# Patient Record
Sex: Male | Born: 1985 | Race: Black or African American | Hispanic: No | Marital: Single | Smoking: Never smoker
Health system: Southern US, Community
[De-identification: ages and names within clinical notes are randomized; demographics above are authoritative.]

---
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
## 2008-01-01 ENCOUNTER — Ambulatory Visit (HOSPITAL_COMMUNITY): Admission: EM | Admit: 2008-01-01 | Discharge: 2008-01-01 | Payer: Self-pay | Admitting: Emergency Medicine

## 2008-02-08 ENCOUNTER — Encounter: Admission: RE | Admit: 2008-02-08 | Discharge: 2008-03-23 | Payer: Self-pay | Admitting: General Surgery

## 2008-03-27 ENCOUNTER — Encounter: Admission: RE | Admit: 2008-03-27 | Discharge: 2008-04-27 | Payer: Self-pay | Admitting: General Surgery

## 2010-03-11 ENCOUNTER — Emergency Department (HOSPITAL_COMMUNITY)
Admission: EM | Admit: 2010-03-11 | Discharge: 2010-03-12 | Payer: Self-pay | Source: Home / Self Care | Admitting: Emergency Medicine

## 2010-08-06 NOTE — Op Note (Signed)
NAMESTEFFEN, HASE                 ACCOUNT NO.:  [id number]   MEDICAL RECORD NO.:  [id number]          PATIENT TYPE:  AMB   LOCATION:  MAJO                         FACILITY:  [facility]   PHYSICIAN:  Johnette Abraham, MD    DATE OF BIRTH:  [birth year]   DATE OF PROCEDURE:  01/01/2008  DATE OF DISCHARGE:  01/01/2008                               OPERATIVE REPORT   PREOPERATIVE DIAGNOSIS:  Complex laceration of the right upper  extremity.   POSTOPERATIVE DIAGNOSIS:  Complex laceration of the right upper  extremity.   PROCEDURES:  1. Exploration of wounds, one on the dorsum of the right hand and one      on the volar aspect of the forearm.  2. Repair of flexor tendons in the forearm listing is as follows.      a.     FPL, FDS to the index finger, long finger, ring finger and       the small finger.      b.     FDP to the index finger, long finger, ring finger and the       small finger.      c.     Palmaris longus, FCR, FCU,  3. Repair of the median and ulnar nerves,  4. Limited forearm fasciotomy.  5. Repair of laceration to the left small finger.  6. Repair of laceration to the left thumb.  7. Repair of laceration to the left hand on the dorsum.  8. Repair of extensor tendon to the left index finger on the dorsum of      the hand.  9. Intermediate Repair of wounds 24cm  10.Simple repair of wounds 7.5cm   ANESTHESIA:  General.   FINDINGS:  Laceration of essentially all flexor tendons in the forearm,  laceration of the median and ulnar nerves, laceration of the extensor  tendon to the index finger, and lacerations to the thumb and small  finger.   SPECIMENS:  None.   ESTIMATED BLOOD LOSS:  10 mL.   COMPLICATIONS:  No acute complications.   INDICATIONS:  Mr. Farabee is a 25 year old gentleman who placed his hand to  a fish tank sustaining a very severe and complex laceration to his right  upper extremity.  He was evaluated in the emergency department and was  counseled in detail  about the severity of his injury.  The risks,  benefits and alternatives of surgery were discussed with the patient and  consent was obtained for surgery.   PROCEDURE:  The patient was taken to the operating room and placed  supine on the operating room table. Prior to prepping of the extremity,  because of the large wound and the active bleeding,  the arm was  elevated and the tourniquet was inflated.  Following the dressing was  removed and the right upper extremity was prepped and draped in the  normal sterile fashion.  There was a large gaping deep wound of the mid  forearm more prominent on the ulnar aspect of the forearm.  This wound  was thoroughly irrigated.  Hematoma was  evacuated.  The wound had to be  extended to gain adequate exposure for all the structures.  It was  evident that there were multiple tendon and nerve injuries as the  laceration extended all the way down to the radius and ulna.  Exploration was begun.  The radial artery was explored and felt to be in  continuity.  The median nerve was completely lacerated in the mid  forearm.  This was repaired with several interrupted 8-0 nylon sutures  approximating the nerve nicely.  Next, repair of the tendons was  performed from the deep layers to the superficial layers.  The flexor  digitorum superficialis to the index, long, ring and small finger were  all individually repaired with 4-0 FiberWire.  The flexor digitorum  profundus tendons to the index, long, ring, and small fingers were all  individually repaired with 4-0 FiberWire.  The ulnar nerve was  completely lacerated and repaired with interrupted 8-0 nylon, an  epineural repair.  The flexor carpi radialis and flexor carpi ulnaris  tendons as well as the palmaris longus tendons were each repaired with 4-  0 FiberWire.  After this was performed, the muscle bellies were each  approximated with 4-0 Vicryl sutures approximating the fascia.  A  limited fasciotomy of the  forearm was performed.  Afterwards, the hand  and dorsum of the hand were evaluated.  There was a longitudinal gaping  laceration over the dorsum of the hand in between the second and third  metacarpals.  The extensor tendon to the index finger was exposed and  lacerated.  4-0 FiberWire was used to repair this tendon.  The wound was  then gently undermined and the skin was closed in two layers with 4-0  Vicryl and then 4-0 Monocryl.  There was a small laceration on the  lateral aspect of the small finger that measured approximately 1 cm that  was closed.  There was a small laceration on the left thumb that was  also superficial and closed with 4-0 Monocryl.  Following the tourniquet  was released.  The tourniquet time was 2 hours.  Hemostasis was  controlled with direct pressure and Bovie.  The wound was then irrigated  again and the skin was then approximated over the volar forearm in  multiple layers with 4-0 Vicryl followed by a subcuticular 4-0 Monocryl  closure.  At the conclusion of the case, the forearm compartment felt  soft.  Xeroform ointment was placed and a large bulky dressing with the  wrist slightly flexed, was placed.  The patient tolerated the procedure  well and was taken to recovery room in stable condition.  Estimated  blood loss 10 mL.      Johnette Abraham, MD  Electronically Signed     HCC/MEDQ  D:  01/03/2008  T:  01/04/2008  Job:  [id number]

## 2010-12-24 LAB — DIFFERENTIAL
Basophils Absolute: 0
Eosinophils Absolute: 0.2
Eosinophils Relative: 7 — ABNORMAL HIGH
Lymphocytes Relative: 51 — ABNORMAL HIGH
Lymphs Abs: 1.6
Monocytes Absolute: 0.2

## 2010-12-24 LAB — POCT I-STAT, CHEM 8
BUN: 16
Creatinine, Ser: 1.4
Glucose, Bld: 96
Potassium: 3.6
Sodium: 141

## 2010-12-24 LAB — CROSSMATCH

## 2010-12-24 LAB — CBC
HCT: 40
Hemoglobin: 13.7
MCV: 91.2
RDW: 12.9

## 2010-12-24 LAB — ETHANOL: Alcohol, Ethyl (B): 50 — ABNORMAL HIGH

## 2017-05-07 ENCOUNTER — Emergency Department (HOSPITAL_COMMUNITY)
Admission: EM | Admit: 2017-05-07 | Discharge: 2017-05-07 | Disposition: A | Discharge status: Home / Self Care | Payer: No Typology Code available for payment source | Attending: Emergency Medicine | Admitting: Emergency Medicine

## 2017-05-07 ENCOUNTER — Encounter (HOSPITAL_COMMUNITY): Payer: Self-pay

## 2017-05-07 ENCOUNTER — Emergency Department (HOSPITAL_COMMUNITY): Payer: No Typology Code available for payment source

## 2017-05-07 DIAGNOSIS — Y92410 Unspecified street and highway as the place of occurrence of the external cause: Secondary | ICD-10-CM | POA: Diagnosis not present

## 2017-05-07 DIAGNOSIS — Y999 Unspecified external cause status: Secondary | ICD-10-CM | POA: Diagnosis not present

## 2017-05-07 DIAGNOSIS — Y9389 Activity, other specified: Secondary | ICD-10-CM | POA: Diagnosis not present

## 2017-05-07 DIAGNOSIS — S63602A Unspecified sprain of left thumb, initial encounter: Secondary | ICD-10-CM | POA: Insufficient documentation

## 2017-05-07 DIAGNOSIS — S6992XA Unspecified injury of left wrist, hand and finger(s), initial encounter: Secondary | ICD-10-CM | POA: Diagnosis present

## 2017-05-07 MED ORDER — CYCLOBENZAPRINE HCL 10 MG PO TABS: 10.0000 mg | ORAL_TABLET | Freq: Two times a day (BID) | ORAL | 0 refills | Status: DC | PRN

## 2017-05-07 MED ORDER — IBUPROFEN 600 MG PO TABS: 600.0000 mg | ORAL_TABLET | Freq: Four times a day (QID) | ORAL | 0 refills | Status: AC | PRN
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
## 2018-05-18 ENCOUNTER — Encounter (HOSPITAL_COMMUNITY): Payer: Self-pay

## 2018-05-18 ENCOUNTER — Emergency Department (HOSPITAL_COMMUNITY)
Admission: EM | Admit: 2018-05-18 | Discharge: 2018-05-18 | Disposition: A | Discharge status: Home / Self Care | Payer: Self-pay | Attending: Emergency Medicine | Admitting: Emergency Medicine

## 2018-05-18 ENCOUNTER — Emergency Department (HOSPITAL_COMMUNITY): Payer: Self-pay

## 2018-05-18 ENCOUNTER — Other Ambulatory Visit: Payer: Self-pay

## 2018-05-18 DIAGNOSIS — J069 Acute upper respiratory infection, unspecified: Secondary | ICD-10-CM | POA: Insufficient documentation

## 2018-05-18 DIAGNOSIS — J320 Chronic maxillary sinusitis: Secondary | ICD-10-CM | POA: Insufficient documentation

## 2018-05-18 MED ORDER — AMOXICILLIN-POT CLAVULANATE 875-125 MG PO TABS: 1.0000 | ORAL_TABLET | Freq: Two times a day (BID) | ORAL | 0 refills | Status: DC

## 2018-05-18 MED ORDER — BENZONATATE 100 MG PO CAPS: 100.0000 mg | ORAL_CAPSULE | Freq: Three times a day (TID) | ORAL | 0 refills | Status: DC

## 2018-05-18 NOTE — Discharge Instructions (Addendum)
Take tylenol and ibuprofen as needed for pain or fever. Follow up with your doctor or return here for worsening symptoms.

## 2018-05-18 NOTE — ED Triage Notes (Signed)
Pt states that he has had a cough and congestion x 1 month. Pt also states that he has had left sided sinus pressure. Pt states he has been coughing up phlegm. Pt concerned for a "lung infection"

## 2018-05-18 NOTE — ED Provider Notes (Signed)
[facility]-EMERGENCY DEPT Provider Note   CSN: [id number] Arrival date & time: 05/18/18  8864    History   Chief Complaint Chief Complaint  Patient presents with  . URI    HPI Travis Snyder is a 33 y.o. male who presents to the ED with cough and congestion x 1 month. Patient also c/o left side facial pain and sinus pressure. Cough is productive with yellow sputum.    The history is provided by the patient. No language interpreter was used.  URI  Presenting symptoms: congestion, cough, ear pain, facial pain, fever and sore throat   Severity:  Moderate Duration: off and on x 2 months. Progression:  Worsening Chronicity:  New Relieved by:  Nothing Worsened by:  Nothing Ineffective treatments:  OTC medications Associated symptoms: myalgias and sinus pain   Associated symptoms: no headaches     History reviewed. No pertinent past medical history.  There are no active problems to display for this patient.   History reviewed. No pertinent surgical history.      Home Medications    Prior to Admission medications   Medication Sig Start Date End Date Taking? Authorizing Provider  amoxicillin-clavulanate (AUGMENTIN) 875-125 MG tablet Take 1 tablet by mouth every 12 (twelve) hours. 05/18/18   Janne Napoleon, NP  benzonatate (TESSALON) 100 MG capsule Take 1 capsule (100 mg total) by mouth every 8 (eight) hours. 05/18/18   Janne Napoleon, NP  cyclobenzaprine (FLEXERIL) 10 MG tablet Take 1 tablet (10 mg total) by mouth 2 (two) times daily as needed for muscle spasms. 05/07/17   Fayrene Helper, PA-C  ibuprofen (ADVIL,MOTRIN) 600 MG tablet Take 1 tablet (600 mg total) by mouth every 6 (six) hours as needed. 05/07/17   Fayrene Helper, PA-C    Family History No family history on file.  Social History Social History   Tobacco Use  . Smoking status: Never Smoker  . Smokeless tobacco: Never Used  Substance Use Topics  . Alcohol use: Never    Frequency: Never  .  Drug use: Never     Allergies   Patient has no known allergies.   Review of Systems Review of Systems  Constitutional: Positive for fever.  HENT: Positive for congestion, ear pain, sinus pressure, sinus pain and sore throat.   Eyes: Negative for pain, discharge, redness and itching.  Respiratory: Positive for cough and shortness of breath (occasionally with exertion).   Cardiovascular: Negative for chest pain.  Gastrointestinal: Negative for abdominal pain, nausea and vomiting (only with cough).  Genitourinary: Negative for dysuria, frequency and urgency.  Musculoskeletal: Positive for myalgias.  Skin: Negative for rash.  Neurological: Negative for headaches.  Psychiatric/Behavioral: Negative for confusion.     Physical Exam Updated Vital Signs BP (!) 130/97 (BP Location: Right Arm)   Pulse 72   Temp 98.3 F (36.8 C) (Oral)   Resp 18   Ht 5\' 10"  (1.778 m)   Wt 87.1 kg   SpO2 97%   BMI 27.55 kg/m   Physical Exam Vitals signs and nursing note reviewed.  Constitutional:      General: He is not in acute distress.    Appearance: He is well-developed.  HENT:     Right Ear: Tympanic membrane normal.     Left Ear: Tympanic membrane normal.     Nose: Congestion present.     Left Turbinates: Swollen.     Left Sinus: Maxillary sinus tenderness present.     Mouth/Throat:  Mouth: Mucous membranes are moist.     Pharynx: Posterior oropharyngeal erythema (mild) present.  Eyes:     Extraocular Movements: Extraocular movements intact.     Conjunctiva/sclera: Conjunctivae normal.  Neck:     Musculoskeletal: Neck supple. No neck rigidity.  Cardiovascular:     Rate and Rhythm: Normal rate and regular rhythm.  Pulmonary:     Effort: Pulmonary effort is normal. No respiratory distress.     Breath sounds: No wheezing or rales.  Abdominal:     Palpations: Abdomen is soft.     Tenderness: There is no abdominal tenderness.  Musculoskeletal: Normal range of motion.    Lymphadenopathy:     Cervical: No cervical adenopathy.  Skin:    General: Skin is warm and dry.  Neurological:     Mental Status: He is alert and oriented to person, place, and time.  Psychiatric:        Mood and Affect: Mood normal.      ED Treatments / Results  Labs (all labs ordered are listed, but only abnormal results are displayed) Labs Reviewed - No data to display  Radiology Dg Chest 2 View  Result Date: 05/18/2018 CLINICAL DATA:  33 year old male with a history of cough and congestion EXAM: CHEST - 2 VIEW COMPARISON:  None. FINDINGS: The heart size and mediastinal contours are within normal limits. Both lungs are clear. The visualized skeletal structures are unremarkable. IMPRESSION: Negative for acute cardiopulmonary disease Electronically Signed   By: Gilmer Mor D.O.   On: 05/18/2018 13:45    Procedures Procedures (including critical care time)  Medications Ordered in ED Medications - No data to display   Initial Impression / Assessment and Plan / ED Course  I have reviewed the triage vital signs and the nursing notes.  Pt CXR negative for acute infiltrate. Patients symptoms are consistent with URI and right maxillary sinusitis. Discussed with the patient clinical and x-ray findings and plan of care. Patient verbalizes understanding and is agreeable with plan. Pt is hemodynamically stable & in NAD prior to dc. Final Clinical Impressions(s) / ED Diagnoses   Final diagnoses:  Right maxillary sinusitis  Acute URI    ED Discharge Orders         Ordered    amoxicillin-clavulanate (AUGMENTIN) 875-125 MG tablet  Every 12 hours     05/18/18 1355    benzonatate (TESSALON) 100 MG capsule  Every 8 hours     05/18/18 1355           Damian Leavell Angleton, NP 05/18/18 1357    Terrilee Files, MD 05/19/18 [phone]

## 2019-03-01 ENCOUNTER — Other Ambulatory Visit: Payer: Self-pay

## 2019-03-01 DIAGNOSIS — Z20822 Contact with and (suspected) exposure to covid-19: Secondary | ICD-10-CM

## 2019-03-03 LAB — NOVEL CORONAVIRUS, NAA: SARS-CoV-2, NAA: NOT DETECTED

## 2021-02-08 IMAGING — CR DG CHEST 2V
2 series · 2 of 2 positions shown · non-contrast
Comparison: None.

CLINICAL DATA: 32-year-old male with a history of cough and
congestion

EXAM:
CHEST - 2 VIEW

[w chest pa]
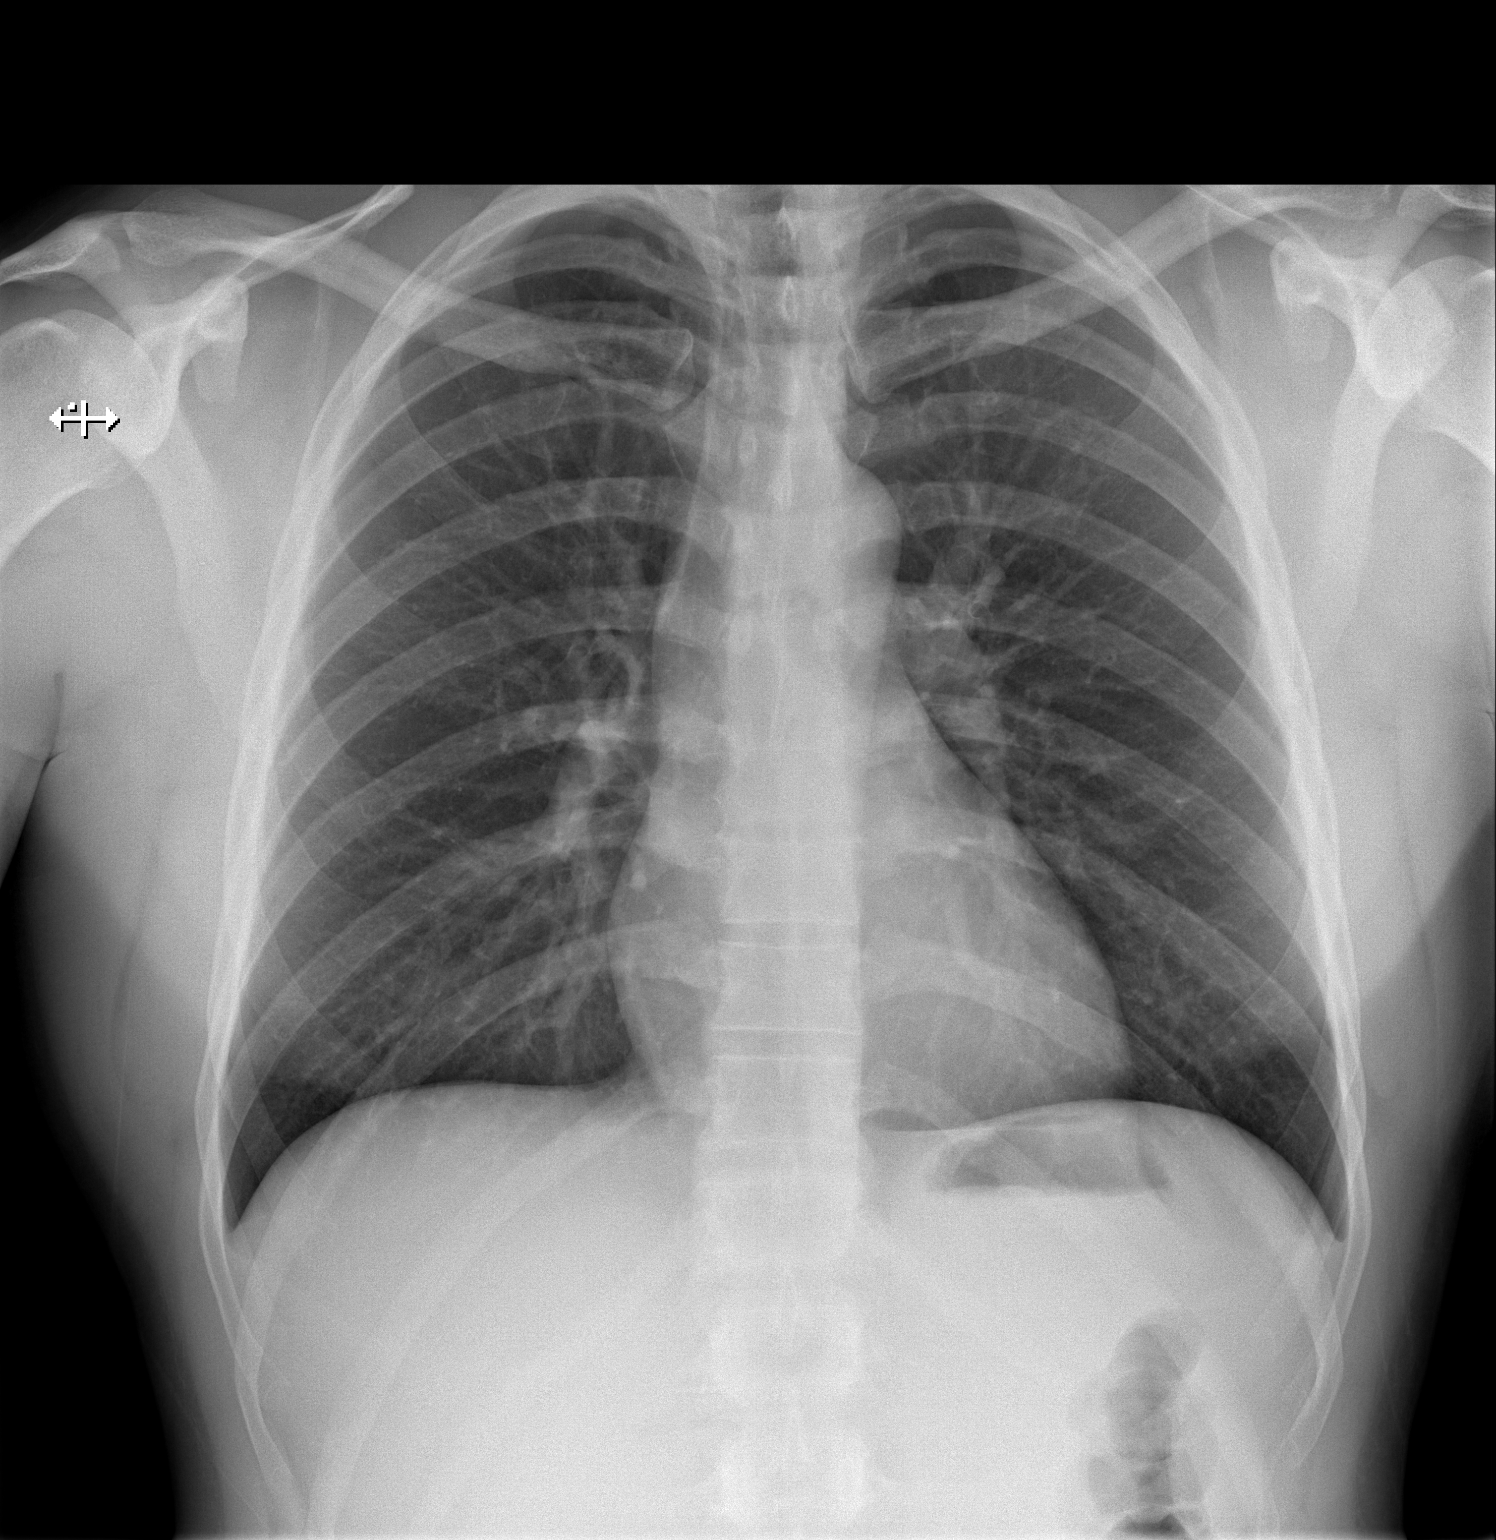

[w chest lat]
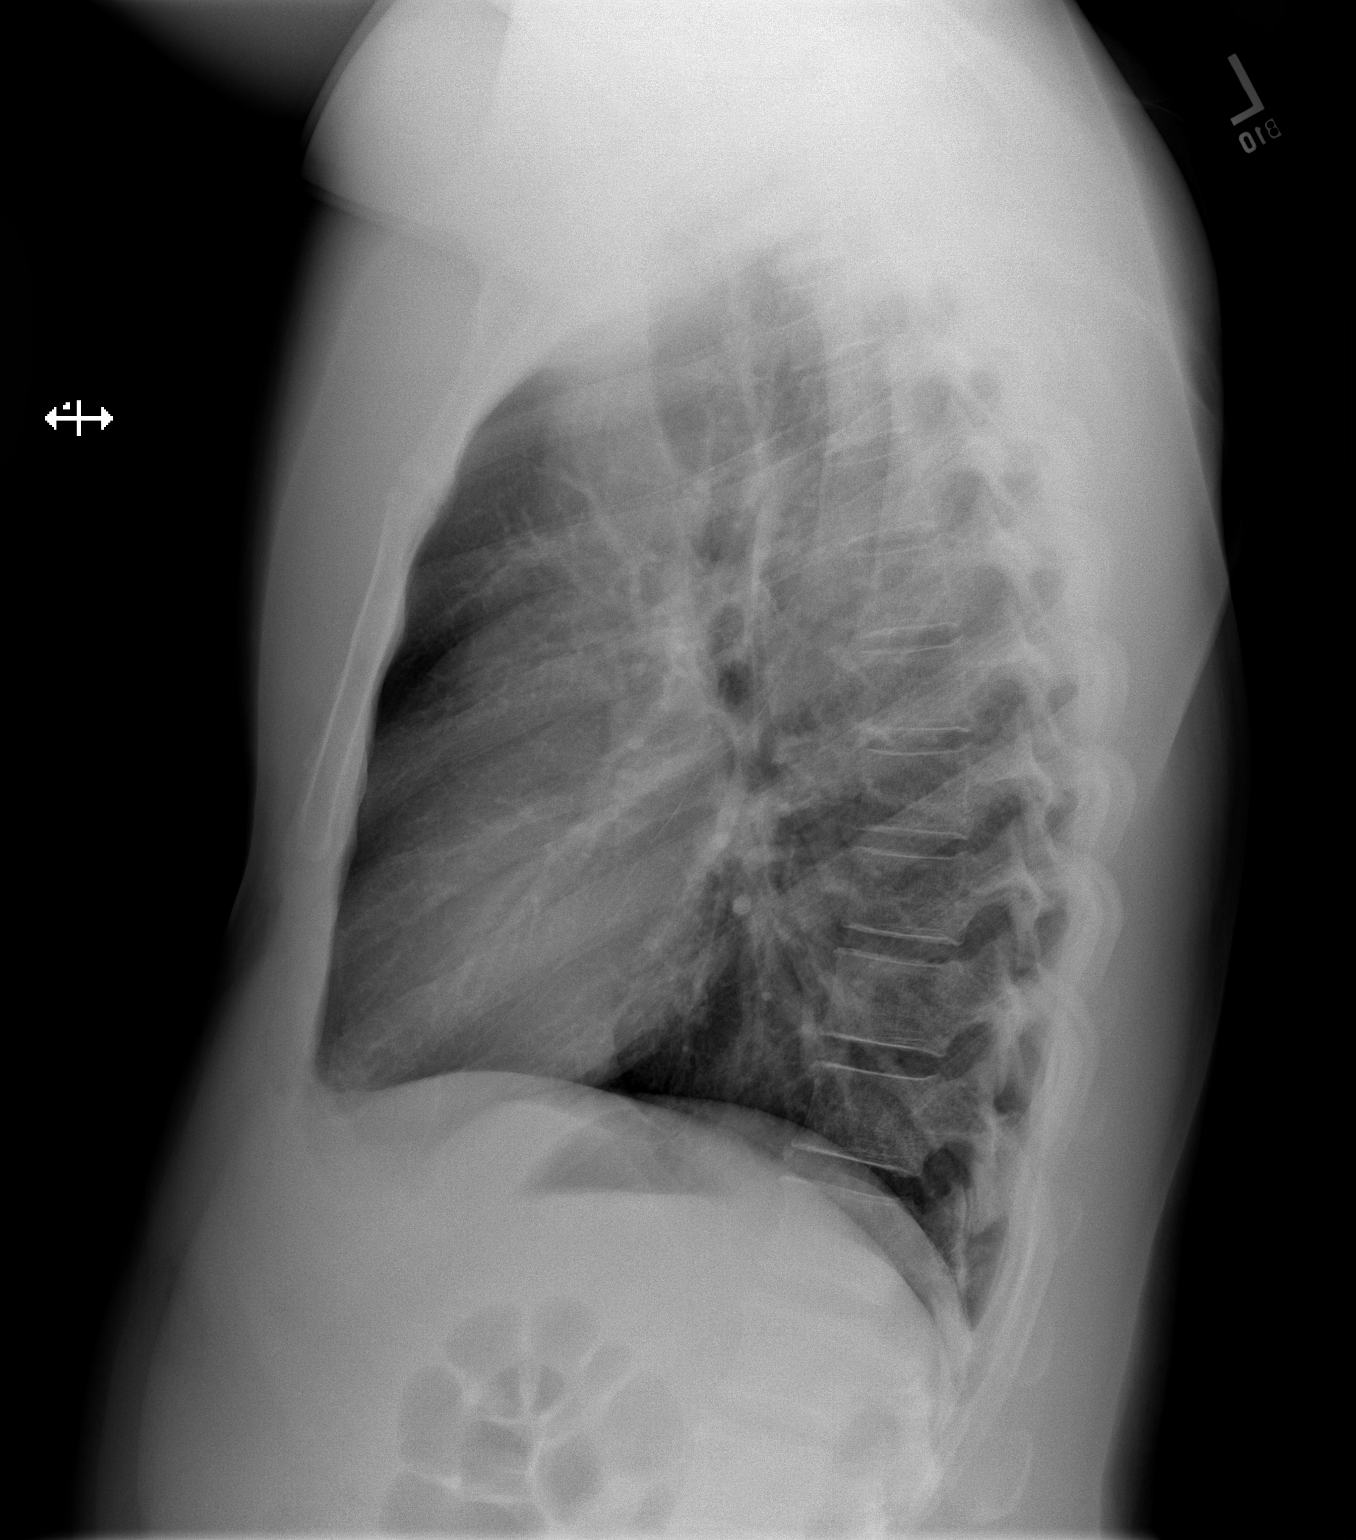

[2 of 2 positions shown; findings below may reference images not displayed]

FINDINGS: The heart size and mediastinal contours are within normal limits.
Both lungs are clear. The visualized skeletal structures are
unremarkable.
IMPRESSION: Negative for acute cardiopulmonary disease

## 2023-10-05 DIAGNOSIS — L293 Anogenital pruritus, unspecified: Secondary | ICD-10-CM | POA: Diagnosis not present

## 2023-10-05 DIAGNOSIS — Z113 Encounter for screening for infections with a predominantly sexual mode of transmission: Secondary | ICD-10-CM | POA: Diagnosis not present

## 2023-10-05 DIAGNOSIS — Z0000 Encounter for general adult medical examination without abnormal findings: Secondary | ICD-10-CM | POA: Diagnosis not present

## 2023-10-05 DIAGNOSIS — Z872 Personal history of diseases of the skin and subcutaneous tissue: Secondary | ICD-10-CM | POA: Diagnosis not present

## 2023-10-05 DIAGNOSIS — L819 Disorder of pigmentation, unspecified: Secondary | ICD-10-CM | POA: Diagnosis not present

## 2023-11-24 DIAGNOSIS — L218 Other seborrheic dermatitis: Secondary | ICD-10-CM | POA: Diagnosis not present

## 2023-11-29 NOTE — Progress Notes (Unsigned)
 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  Patient has one Cbc showed moderate neutropenia. No other concerning findings on CBC. Negative for HIV.  He denies any concerning symptoms.  He is slightly anxious about coming here.  Will repeat CBC, obtain additional testing today.  If improvement, short-term follow-up is advised.  If concerning findings, further testing will be requested.  Assessment & Plan Other neutropenia (HCC) Check CBC, CMP and LDH B12, folate, copper  viral hepatitis Flow cytometry  Follow-up tentatively next month  Orders Placed This Encounter  Procedures   CBC with Differential (Cancer Center Only)    Standing Status:   Future    Expiration Date:   11/29/2024   CMP (Cancer Center only)    Standing Status:   Future    Expiration Date:   11/29/2024   Lactate dehydrogenase    Standing Status:   Future    Expiration Date:   11/29/2024   Folate    Standing Status:   Future    Expiration Date:   11/29/2024   Copper , serum    Standing Status:   Future    Expiration Date:   11/29/2024   Vitamin B12    Standing Status:   Future    Expiration Date:   11/29/2024   Methylmalonic acid, serum    Standing Status:   Future    Expiration Date:   12/30/2023   Hepatitis panel, acute    Standing Status:   Future    Expiration Date:   11/29/2024   Flow Cytometry, Peripheral Blood (Oncology)    Standing Status:   Future    Expiration Date:   11/29/2024    Pauletta JAYSON Chihuahua, MD 11/30/2023 3:15 PM   CHIEF COMPLAINTS/PURPOSE OF CONSULTATION:  Leukopenia   HISTORY OF PRESENTING ILLNESS:  Eva RAYMOND Edison 38 y.o. male is here because of decreased WBC.  Available records reviewed from [facility].  Report patient was establishing care with new primary care provider.  Exam reportedly normal.   Labs showed WBC of 2.7, hemoglobin of 15.  MCV 94, platelet 257.  ANC of 0.8.  LFTs were normal.  HIV was negative.  HSV 1 IgG and HSV-2 IgG were reactive.  Patient was referred here for further evaluation.  There has been no personal history of immunodeficiency or significant childhood disorder.  Clinically without lymph node swelling, fever, drenching night sweats, unintentional weight loss, abdominal distention.  No chest pain, tightness, shortness of breath.  No rash, joint pain or other symptoms.  He denies any new medication recently.  Denies heavy alcohol use.  MEDICAL HISTORY:  No past medical history on file.  SURGICAL HISTORY: No past surgical history on file.  SOCIAL HISTORY: Social History   Socioeconomic History   Marital status: Single    Spouse name: Not on file   Number of children: Not on file   Years of education: Not on file   Highest education level: Not on file  Occupational History   Not on file  Tobacco Use   Smoking status: Never   Smokeless tobacco: Never  Substance and Sexual Activity   Alcohol use: Never   Drug use: Never   Sexual activity: Not on file  Other Topics Concern   Not on file  Social History Narrative   Not on  file   Social Drivers of Health   Financial Resource Strain: Not on file  Food Insecurity: No Food Insecurity (11/30/2023)   Hunger Vital Sign    Worried About Running Out of Food in the Last Year: Never true    Ran Out of Food in the Last Year: Never true  Transportation Needs: No Transportation Needs (11/30/2023)   PRAPARE - Administrator, Civil Service (Medical): No    Lack of Transportation (Non-Medical): No  Physical Activity: Not on file  Stress: Not on file  Social Connections: Not on file  Intimate Partner Violence: Not At Risk (11/30/2023)   Humiliation, Afraid, Rape, and Kick questionnaire    Fear of Current or Ex-Partner: No    Emotionally Abused: No    Physically Abused: No    Sexually Abused: No     FAMILY HISTORY: No family history on file.  ALLERGIES:  has no known allergies.  MEDICATIONS:  Current Outpatient Medications  Medication Sig Dispense Refill   ibuprofen  (ADVIL ,MOTRIN ) 600 MG tablet Take 1 tablet (600 mg total) by mouth every 6 (six) hours as needed. (Patient not taking: Reported on 11/30/2023) 30 tablet 0   No current facility-administered medications for this visit.    REVIEW OF SYSTEMS:   All relevant systems were reviewed with the patient and are negative.  PHYSICAL EXAMINATION:  Vitals:   11/30/23 1444 11/30/23 1446  BP: (!) 146/100 (!) 145/100  Pulse: 72   Resp: 20   Temp: 98.1 F (36.7 C)   SpO2: 98%    Filed Weights   11/30/23 1444  Weight: 201 lb 3.2 oz (91.3 kg)    GENERAL: alert, no distress and comfortable SKIN: skin color normal and no bruising or petechiae on exposed skin EYES: normal, sclera clear OROPHARYNX: no exudate  NECK: No palpable mass LYMPH:  no palpable cervical, axillary lymphadenopathy  LUNGS: clear to auscultation and percussion with normal breathing effort HEART: regular rate & rhythm and no murmurs  ABDOMEN: abdomen soft, non-tender and nondistended. Musculoskeletal: no edema   LABORATORY DATA:  I have reviewed the data as listed Labs ordered.  RADIOGRAPHIC STUDIES: I have personally reviewed the radiological images as listed and agreed with the findings in the report.

## 2023-11-30 ENCOUNTER — Inpatient Hospital Stay

## 2023-11-30 VITALS — BP 145/100 | HR 72 | Temp 98.1°F | Resp 20 | Wt 201.2 lb

## 2023-11-30 DIAGNOSIS — R7402 Elevation of levels of lactic acid dehydrogenase (LDH): Secondary | ICD-10-CM | POA: Insufficient documentation

## 2023-11-30 DIAGNOSIS — B199 Unspecified viral hepatitis without hepatic coma: Secondary | ICD-10-CM | POA: Insufficient documentation

## 2023-11-30 DIAGNOSIS — E538 Deficiency of other specified B group vitamins: Secondary | ICD-10-CM | POA: Insufficient documentation

## 2023-11-30 DIAGNOSIS — D708 Other neutropenia: Secondary | ICD-10-CM | POA: Insufficient documentation

## 2023-11-30 LAB — HEPATITIS PANEL, ACUTE
HCV Ab: NONREACTIVE
Hep A IgM: NONREACTIVE
Hep B C IgM: NONREACTIVE
Hepatitis B Surface Ag: NONREACTIVE

## 2023-11-30 LAB — CMP (CANCER CENTER ONLY)
ALT: 43 U/L (ref 0–44)
AST: 38 U/L (ref 15–41)
Albumin: 5.2 g/dL — ABNORMAL HIGH (ref 3.5–5.0)
Alkaline Phosphatase: 59 U/L (ref 38–126)
Anion gap: 8 (ref 5–15)
BUN: 21 mg/dL — ABNORMAL HIGH (ref 6–20)
CO2: 29 mmol/L (ref 22–32)
Calcium: 10.1 mg/dL (ref 8.9–10.3)
Chloride: 103 mmol/L (ref 98–111)
Creatinine: 1.13 mg/dL (ref 0.61–1.24)
GFR, Estimated: >60 mL/min (ref >60)
Glucose, Bld: 94 mg/dL (ref 70–99)
Potassium: 4.1 mmol/L (ref 3.5–5.1)
Sodium: 140 mmol/L (ref 135–145)
Total Bilirubin: 0.4 mg/dL (ref 0.0–1.2)
Total Protein: 8.2 g/dL — ABNORMAL HIGH (ref 6.5–8.1)

## 2023-11-30 LAB — CBC WITH DIFFERENTIAL (CANCER CENTER ONLY)
Abs Immature Granulocytes: 0.01 K/uL (ref 0.00–0.07)
Basophils Absolute: 0 K/uL (ref 0.0–0.1)
Basophils Relative: 1 %
Eosinophils Absolute: 0.1 K/uL (ref 0.0–0.5)
Eosinophils Relative: 2 %
HCT: 42.7 % (ref 39.0–52.0)
Hemoglobin: 14.6 g/dL (ref 13.0–17.0)
Immature Granulocytes: 0 %
Lymphocytes Relative: 37 %
Lymphs Abs: 1.3 K/uL (ref 0.7–4.0)
MCH: 30.2 pg (ref 26.0–34.0)
MCHC: 34.2 g/dL (ref 30.0–36.0)
MCV: 88.2 fL (ref 80.0–100.0)
Monocytes Absolute: 0.3 K/uL (ref 0.1–1.0)
Monocytes Relative: 7 %
Neutro Abs: 1.8 K/uL (ref 1.7–7.7)
Neutrophils Relative %: 53 %
Platelet Count: 262 K/uL (ref 150–400)
RBC: 4.84 MIL/uL (ref 4.22–5.81)
RDW: 13.2 % (ref 11.5–15.5)
WBC Count: 3.5 K/uL — ABNORMAL LOW (ref 4.0–10.5)
nRBC: 0 % (ref 0.0–0.2)

## 2023-11-30 LAB — VITAMIN B12: Vitamin B-12: 570 pg/mL (ref 180–914)

## 2023-11-30 LAB — FOLATE: Folate: 6.1 ng/mL (ref >5.9)

## 2023-11-30 LAB — LACTATE DEHYDROGENASE: LDH: 252 U/L — ABNORMAL HIGH (ref 98–192)

## 2023-12-02 LAB — COPPER, SERUM: Copper: 91 ug/dL (ref 69–132)

## 2023-12-03 LAB — SURGICAL PATHOLOGY

## 2023-12-04 ENCOUNTER — Ambulatory Visit: Payer: Self-pay

## 2023-12-04 DIAGNOSIS — D708 Other neutropenia: Secondary | ICD-10-CM

## 2023-12-04 LAB — FLOW CYTOMETRY

## 2023-12-04 NOTE — Progress Notes (Signed)
 Contacted pt per Dr Tina: let him know the blood count looks better.  We will repeat again in about 4 weeks, 1 day before his visit. Pt acknowledged information and verbalized understanding. My Chart message sent with pt's appointments.

## 2023-12-06 LAB — METHYLMALONIC ACID, SERUM: Methylmalonic Acid, Quantitative: 102 nmol/L (ref 0–378)

## 2023-12-29 ENCOUNTER — Other Ambulatory Visit: Payer: Self-pay

## 2023-12-29 DIAGNOSIS — D72819 Decreased white blood cell count, unspecified: Secondary | ICD-10-CM

## 2023-12-29 NOTE — Progress Notes (Unsigned)
 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   Patient has one Cbc showed moderate neutropenia. No other concerning findings on CBC. Negative for HIV.  He denies any concerning symptoms.  Repeat labs showed WBC of 3.5.  Normal differential including ANC.  Borderline elevated LDH.  Folate was low normal.  Short-term follow-up showed Assessment & Plan   No orders of the defined types were placed in this encounter.    Travis JAYSON Chihuahua, MD  INTERVAL HISTORY: Travis Snyder returns for follow-up.  Initial history: Initial consult for leukopenia.  Outside record showed WBC of 2.7, hemoglobin of 15.  MCV 94, platelet 257.  ANC of 0.8.  LFTs were normal.  HIV was negative.  HSV 1 IgG and HSV-2 IgG were reactive.  Patient was referred here for further evaluation.   There has been no personal history of immunodeficiency or significant childhood disorder.  Clinically without lymph node swelling, fever, drenching night sweats, unintentional weight loss, abdominal distention.  No chest pain, tightness, shortness of breath.  No rash, joint pain or other symptoms.   He denies any new medication recently.   Denies heavy alcohol use.  PHYSICAL EXAMINATION: ECOG PERFORMANCE STATUS: {CHL ONC ECOG PS:[id number]}  There were no vitals filed for this visit. There were no vitals filed for this visit.  Relevant data reviewed during this visit included ***

## 2023-12-30 ENCOUNTER — Inpatient Hospital Stay

## 2023-12-30 DIAGNOSIS — Z79899 Other long term (current) drug therapy: Secondary | ICD-10-CM | POA: Diagnosis not present

## 2023-12-30 DIAGNOSIS — D708 Other neutropenia: Secondary | ICD-10-CM | POA: Insufficient documentation

## 2023-12-30 DIAGNOSIS — D72819 Decreased white blood cell count, unspecified: Secondary | ICD-10-CM

## 2023-12-30 LAB — CBC WITH DIFFERENTIAL (CANCER CENTER ONLY)
Abs Immature Granulocytes: 0 K/uL (ref 0.00–0.07)
Basophils Absolute: 0 K/uL (ref 0.0–0.1)
Basophils Relative: 1 %
Eosinophils Absolute: 0.2 K/uL (ref 0.0–0.5)
Eosinophils Relative: 5 %
HCT: 40.4 % (ref 39.0–52.0)
Hemoglobin: 13.9 g/dL (ref 13.0–17.0)
Immature Granulocytes: 0 %
Lymphocytes Relative: 48 %
Lymphs Abs: 1.6 K/uL (ref 0.7–4.0)
MCH: 30.5 pg (ref 26.0–34.0)
MCHC: 34.4 g/dL (ref 30.0–36.0)
MCV: 88.6 fL (ref 80.0–100.0)
Monocytes Absolute: 0.3 K/uL (ref 0.1–1.0)
Monocytes Relative: 9 %
Neutro Abs: 1.2 K/uL — ABNORMAL LOW (ref 1.7–7.7)
Neutrophils Relative %: 37 %
Platelet Count: 238 K/uL (ref 150–400)
RBC: 4.56 MIL/uL (ref 4.22–5.81)
RDW: 12.9 % (ref 11.5–15.5)
WBC Count: 3.3 K/uL — ABNORMAL LOW (ref 4.0–10.5)
nRBC: 0 % (ref 0.0–0.2)

## 2023-12-30 LAB — LACTATE DEHYDROGENASE: LDH: 182 U/L (ref 98–192)

## 2023-12-30 LAB — FOLATE: Folate: 11.3 ng/mL (ref >5.9)

## 2023-12-31 ENCOUNTER — Inpatient Hospital Stay

## 2023-12-31 VITALS — BP 128/80 | HR 85 | Temp 98.2°F | Resp 17 | Ht 70.0 in | Wt 199.2 lb

## 2023-12-31 DIAGNOSIS — Z79899 Other long term (current) drug therapy: Secondary | ICD-10-CM | POA: Diagnosis not present

## 2023-12-31 DIAGNOSIS — D708 Other neutropenia: Secondary | ICD-10-CM | POA: Diagnosis not present

## 2023-12-31 DIAGNOSIS — D709 Neutropenia, unspecified: Secondary | ICD-10-CM | POA: Insufficient documentation

## 2023-12-31 NOTE — Assessment & Plan Note (Signed)
 No other concerning findings Will follow-up as needed Follow-up with PCP and check CBC annually Return if worsening cytopenia or new concerning findings.
